# Patient Record
Sex: Male | Born: 1975 | Race: White | Hispanic: No | Marital: Married | Smoking: Never smoker
Health system: Southern US, Community
[De-identification: ages and names within clinical notes are randomized; demographics above are authoritative.]

## PROBLEM LIST (undated history)

## (undated) HISTORY — PX: HERNIA REPAIR: SHX51

---

## 2019-11-16 ENCOUNTER — Emergency Department

## 2019-11-16 ENCOUNTER — Other Ambulatory Visit: Payer: Self-pay

## 2019-11-16 ENCOUNTER — Emergency Department
Admission: EM | Admit: 2019-11-16 | Discharge: 2019-11-16 | Disposition: A | Discharge status: Home / Self Care | Attending: Emergency Medicine | Admitting: Emergency Medicine

## 2019-11-16 DIAGNOSIS — Y929 Unspecified place or not applicable: Secondary | ICD-10-CM | POA: Insufficient documentation

## 2019-11-16 DIAGNOSIS — R600 Localized edema: Secondary | ICD-10-CM | POA: Insufficient documentation

## 2019-11-16 DIAGNOSIS — Y999 Unspecified external cause status: Secondary | ICD-10-CM | POA: Diagnosis not present

## 2019-11-16 DIAGNOSIS — R519 Headache, unspecified: Secondary | ICD-10-CM | POA: Diagnosis not present

## 2019-11-16 DIAGNOSIS — M79662 Pain in left lower leg: Secondary | ICD-10-CM | POA: Insufficient documentation

## 2019-11-16 DIAGNOSIS — R9389 Abnormal findings on diagnostic imaging of other specified body structures: Secondary | ICD-10-CM

## 2019-11-16 DIAGNOSIS — Y939 Activity, unspecified: Secondary | ICD-10-CM | POA: Diagnosis not present

## 2019-11-16 DIAGNOSIS — R911 Solitary pulmonary nodule: Secondary | ICD-10-CM

## 2019-11-16 DIAGNOSIS — M542 Cervicalgia: Secondary | ICD-10-CM | POA: Diagnosis not present

## 2019-11-16 DIAGNOSIS — R0789 Other chest pain: Secondary | ICD-10-CM | POA: Insufficient documentation

## 2019-11-16 DIAGNOSIS — M10072 Idiopathic gout, left ankle and foot: Secondary | ICD-10-CM

## 2019-11-16 LAB — CBC WITH DIFFERENTIAL/PLATELET
Abs Immature Granulocytes: 0.17 10*3/uL — ABNORMAL HIGH (ref 0.00–0.07)
Basophils Absolute: 0.1 10*3/uL (ref 0.0–0.1)
Basophils Relative: 1 %
Eosinophils Absolute: 0.3 10*3/uL (ref 0.0–0.5)
Eosinophils Relative: 2 %
HCT: 42.7 % (ref 39.0–52.0)
Hemoglobin: 15 g/dL (ref 13.0–17.0)
Immature Granulocytes: 1 %
Lymphocytes Relative: 15 %
Lymphs Abs: 1.9 10*3/uL (ref 0.7–4.0)
MCH: 30.5 pg (ref 26.0–34.0)
MCHC: 35.1 g/dL (ref 30.0–36.0)
MCV: 87 fL (ref 80.0–100.0)
Monocytes Absolute: 1 10*3/uL (ref 0.1–1.0)
Monocytes Relative: 8 %
Neutro Abs: 9.1 10*3/uL — ABNORMAL HIGH (ref 1.7–7.7)
Neutrophils Relative %: 73 %
Platelets: 301 10*3/uL (ref 150–400)
RBC: 4.91 MIL/uL (ref 4.22–5.81)
RDW: 11.9 % (ref 11.5–15.5)
WBC: 12.5 10*3/uL — ABNORMAL HIGH (ref 4.0–10.5)
nRBC: 0 % (ref 0.0–0.2)

## 2019-11-16 LAB — COMPREHENSIVE METABOLIC PANEL
ALT: 16 U/L (ref 0–44)
AST: 17 U/L (ref 15–41)
Albumin: 4.2 g/dL (ref 3.5–5.0)
Alkaline Phosphatase: 62 U/L (ref 38–126)
Anion gap: 6 (ref 5–15)
BUN: 23 mg/dL — ABNORMAL HIGH (ref 6–20)
CO2: 25 mmol/L (ref 22–32)
Calcium: 8.8 mg/dL — ABNORMAL LOW (ref 8.9–10.3)
Chloride: 108 mmol/L (ref 98–111)
Creatinine, Ser: 1.47 mg/dL — ABNORMAL HIGH (ref 0.61–1.24)
GFR calc Af Amer: >60 mL/min (ref >60)
GFR calc non Af Amer: 57 mL/min — ABNORMAL LOW (ref >60)
Glucose, Bld: 99 mg/dL (ref 70–99)
Potassium: 3.6 mmol/L (ref 3.5–5.1)
Sodium: 139 mmol/L (ref 135–145)
Total Bilirubin: 0.9 mg/dL (ref 0.3–1.2)
Total Protein: 7.6 g/dL (ref 6.5–8.1)

## 2019-11-16 LAB — TROPONIN I (HIGH SENSITIVITY): Troponin I (High Sensitivity): 2 ng/L (ref <18)

## 2019-11-16 MED ORDER — IOHEXOL 300 MG/ML  SOLN
100.0000 mL | Freq: Once | INTRAMUSCULAR | Status: AC | PRN
  Administered 2019-11-16: 100 mL via INTRAVENOUS

## 2019-11-16 MED ORDER — METHYLPREDNISOLONE SODIUM SUCC 125 MG IJ SOLR
100.0000 mg | Freq: Once | INTRAMUSCULAR | Status: AC
  Administered 2019-11-16: 100 mg via INTRAVENOUS
  Filled 2019-11-16: qty 2

## 2019-11-16 MED ORDER — SODIUM CHLORIDE 0.9 % IV BOLUS
1000.0000 mL | Freq: Once | INTRAVENOUS | Status: AC
  Administered 2019-11-16: 1000 mL via INTRAVENOUS

## 2019-11-16 MED ORDER — PREDNISONE 20 MG PO TABS
ORAL_TABLET | ORAL | 0 refills | Status: AC
Start: 2019-11-16 — End: 2019-11-28

## 2019-11-16 MED ORDER — ONDANSETRON 4 MG PO TBDP
4.0000 mg | ORAL_TABLET | Freq: Three times a day (TID) | ORAL | 0 refills | Status: AC | PRN
Start: 2019-11-16 — End: ?

## 2019-11-16 NOTE — ED Provider Notes (Signed)
EKG: Normal sinus rhythm, ventricular rate 68.  PR 120, QRS 102, QTc 442.  No acute ST elevations or depressions.  No EKG evidence of acute ischemia or infarct.   Shaune Pollack, MD 11/16/19 2005
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
## 2019-11-16 NOTE — ED Notes (Signed)
Pt taken for scans 
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
## 2019-11-16 NOTE — ED Notes (Signed)
Pt asking about wait time for CT- called CT and inquired about when he would be able to go- CT states he would be next- pt informed

## 2019-11-16 NOTE — ED Notes (Signed)
Report given to Maureen, RN.
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
